# Patient Record
Sex: Female | Born: 1995 | Race: Black or African American | Hispanic: No | Marital: Single | Smoking: Never smoker
Health system: Southern US, Community
[De-identification: ages and names within clinical notes are randomized; demographics above are authoritative.]

## PROBLEM LIST (undated history)

## (undated) DIAGNOSIS — E669 Obesity, unspecified: Secondary | ICD-10-CM

## (undated) DIAGNOSIS — J45909 Unspecified asthma, uncomplicated: Secondary | ICD-10-CM

---
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
## 2015-11-07 ENCOUNTER — Emergency Department (HOSPITAL_COMMUNITY)
Admission: EM | Admit: 2015-11-07 | Discharge: 2015-11-07 | Disposition: A | Discharge status: Home / Self Care | Payer: Medicaid Other | Attending: Emergency Medicine | Admitting: Emergency Medicine

## 2015-11-07 ENCOUNTER — Encounter (HOSPITAL_COMMUNITY): Payer: Self-pay | Admitting: *Deleted

## 2015-11-07 DIAGNOSIS — J45909 Unspecified asthma, uncomplicated: Secondary | ICD-10-CM | POA: Insufficient documentation

## 2015-11-07 DIAGNOSIS — E669 Obesity, unspecified: Secondary | ICD-10-CM | POA: Insufficient documentation

## 2015-11-07 DIAGNOSIS — J020 Streptococcal pharyngitis: Secondary | ICD-10-CM | POA: Diagnosis not present

## 2015-11-07 DIAGNOSIS — J029 Acute pharyngitis, unspecified: Secondary | ICD-10-CM | POA: Diagnosis present

## 2015-11-07 DIAGNOSIS — Z79899 Other long term (current) drug therapy: Secondary | ICD-10-CM | POA: Insufficient documentation

## 2015-11-07 DIAGNOSIS — H748X3 Other specified disorders of middle ear and mastoid, bilateral: Secondary | ICD-10-CM | POA: Insufficient documentation

## 2015-11-07 LAB — RAPID STREP SCREEN (MED CTR MEBANE ONLY): STREPTOCOCCUS, GROUP A SCREEN (DIRECT): POSITIVE — AB

## 2015-11-07 MED ORDER — CETIRIZINE HCL 10 MG PO TABS: 10.0000 mg | ORAL_TABLET | Freq: Every day | ORAL | Status: DC

## 2015-11-07 MED ORDER — AMOXICILLIN 500 MG PO CAPS
500.0000 mg | ORAL_CAPSULE | Freq: Once | ORAL | Status: AC
  Administered 2015-11-07: 500 mg via ORAL
  Filled 2015-11-07: qty 1

## 2015-11-07 MED ORDER — NAPROXEN 250 MG PO TABS: 250.0000 mg | ORAL_TABLET | Freq: Two times a day (BID) | ORAL | Status: DC

## 2015-11-07 MED ORDER — AMOXICILLIN 500 MG PO CAPS: 500.0000 mg | ORAL_CAPSULE | Freq: Three times a day (TID) | ORAL | Status: DC

## 2015-11-07 MED ORDER — DEXAMETHASONE 10 MG/ML FOR PEDIATRIC ORAL USE
10.0000 mg | Freq: Once | INTRAMUSCULAR | Status: AC
  Administered 2015-11-07: 10 mg via ORAL
  Filled 2015-11-07: qty 1

## 2015-11-07 NOTE — ED Notes (Signed)
Pt reports sore throat and h/a x 2 days.  Worse when swallowing.

## 2015-11-07 NOTE — Progress Notes (Signed)
Patient listed as having Medicaid [place] [employer] insurance.  Pcp listed on patient's insurance card is located at [facility] [phone].  Patient confirms this is her pcp.  System updated.

## 2015-11-07 NOTE — Discharge Instructions (Signed)

## 2015-11-07 NOTE — ED Provider Notes (Signed)
CSN: [id number]     Arrival date & time 11/07/15  1443 History  By signing my name below, I, Freida Busman, attest that this documentation has been prepared under the direction and in the presence of non-physician practitioner, Everlene Farrier, PA-C. Electronically Signed: Freida Busman, Scribe. 11/07/2015. 5:19 PM.   Chief Complaint  Patient presents with  . Sore Throat    The history is provided by the patient. No language interpreter was used.    HPI Comments:  Heather Gordon is a 20 y.o. female who presents to the Emergency Department complaining of sore throat x 3 days. She reports 5/10 pain. Pt is able to swallow her secretions but notes her pain is worse when doing so. Pt reports associated bilateral ear pressure and fever that has resolved.  She denies cough and neck pain. No alleviating factors noted. No trouble swallowing.    Past Medical History  Diagnosis Date  . Obese   . Asthma    History reviewed. No pertinent past surgical history. No family history on file. Social History  Substance Use Topics  . Smoking status: Never Smoker   . Smokeless tobacco: None  . Alcohol Use: No   OB History    No data available     Review of Systems  Constitutional: Positive for fever (resolved).  HENT: Positive for ear pain and sore throat. Negative for congestion, rhinorrhea, trouble swallowing and voice change.   Eyes: Negative for visual disturbance.  Respiratory: Negative for cough and shortness of breath.   Musculoskeletal: Negative for neck pain and neck stiffness.  Skin: Negative for rash.  Neurological: Negative for light-headedness and headaches.   Allergies  Review of patient's allergies indicates no known allergies.  Home Medications   Prior to Admission medications   Medication Sig Start Date End Date Taking? Authorizing Provider  ibuprofen (ADVIL,MOTRIN) 200 MG tablet Take 200 mg by mouth every 6 (six) hours as needed for headache.   Yes Historical Provider, MD   albuterol (PROVENTIL HFA;VENTOLIN HFA) 108 (90 BASE) MCG/ACT inhaler Inhale 1-2 puffs into the lungs every 6 (six) hours as needed for wheezing. 03/04/13   Santiago Glad, PA-C  amoxicillin (AMOXIL) 500 MG capsule Take 1 capsule (500 mg total) by mouth 3 (three) times daily. 11/07/15   Everlene Farrier, PA-C  cetirizine (ZYRTEC ALLERGY) 10 MG tablet Take 1 tablet (10 mg total) by mouth daily. 11/07/15   Everlene Farrier, PA-C  naproxen (NAPROSYN) 250 MG tablet Take 1 tablet (250 mg total) by mouth 2 (two) times daily with a meal. 11/07/15   Everlene Farrier, PA-C  predniSONE (DELTASONE) 20 MG tablet Take 3 tablets (60 mg total) by mouth daily. Patient not taking: Reported on 11/07/2015 03/04/13   Santiago Glad, PA-C   BP 122/87 mmHg  Pulse 101  Temp(Src) 98.8 F (37.1 C) (Oral)  Resp 14  Ht  (1.651 m)  Wt 163.295 kg  BMI 59.91 kg/m2  SpO2 100%  LMP  Physical Exam  Constitutional: She appears well-developed and well-nourished. No distress.  Nontoxic appearing.  HENT:  Head: Normocephalic and atraumatic.  Right Ear: External ear normal. A middle ear effusion is present.  Left Ear: External ear normal. A middle ear effusion is present.  Mouth/Throat: Uvula is midline. No trismus in the jaw. No oropharyngeal exudate.  No TM erythema or loss of landmarks. Mild middle ear effusion bilaterally.  Moderate bilateral tonsillar hyperthrophy without exudate Uvula is midline without edema No trismus, or dooling. No PTA.  Eyes: Conjunctivae are normal. Pupils are equal, round, and reactive to light. Right eye exhibits no discharge. Left eye exhibits no discharge.  Neck: Normal range of motion. Neck supple. No JVD present. No tracheal deviation present.  Cardiovascular: Normal rate, regular rhythm, normal heart sounds and intact distal pulses.   Pulmonary/Chest: Effort normal and breath sounds normal. No stridor. No respiratory distress. She has no wheezes. She has no rales.  Abdominal: Soft. There  is no tenderness. There is no guarding.  Lymphadenopathy:    She has no cervical adenopathy.  Neurological: She is alert. Coordination normal.  Skin: Skin is warm and dry. No rash noted. She is not diaphoretic. No erythema. No pallor.  Psychiatric: She has a normal mood and affect. Her behavior is normal.  Nursing note and vitals reviewed.   ED Course  Procedures   DIAGNOSTIC STUDIES:  Oxygen Saturation is 100% on RA, normal by my interpretation.    COORDINATION OF CARE:  5:18 PM Discussed treatment plan with pt at bedside and pt agreed to plan.  Labs Review Labs Reviewed  RAPID STREP SCREEN (NOT AT [facility]) - Abnormal; Notable for the following:    Streptococcus, Group A Screen (Direct) POSITIVE (*)    All other components within normal limits    Imaging Review No results found. I have personally reviewed and evaluated these lab results as part of my medical decision-making.   MDM  This is a 20 y.o. female who presents to the Emergency Department complaining of sore throat x 3 days. She reports 5/10 pain. Pt is able to swallow her secretions but notes her pain is worse when doing so. Pt reports associated bilateral ear pressure and fever that has resolved.   Pt rapid strep test positive. Pt is tolerating secretions. Presentation not concerning for peritonsillar abscess or spread of infection to deep spaces of the throat; patent airway. Pt will be discharged with Amoxicillin. Decadron prior to discharge. She is tolerating PO.   Specific return precautions discussed. Recommended PCP follow up. Pt appears safe for discharge.  I advised the patient to follow-up with their primary care provider this week. I advised the patient to return to the emergency department with new or worsening symptoms or new concerns. The patient verbalized understanding and agreement with plan.     Final diagnoses:  Strep throat    I personally performed the services described in this documentation, which  was scribed in my presence. The recorded information has been reviewed and is accurate.      Everlene FarrierWilliam Yamir Carignan, PA-C 11/07/15 1728  Richardean Canalavid H Yao, MD 11/09/15 [phone]

## 2017-02-08 ENCOUNTER — Emergency Department (HOSPITAL_COMMUNITY)
Admission: EM | Admit: 2017-02-08 | Discharge: 2017-02-09 | Disposition: A | Discharge status: Home / Self Care | Payer: No Typology Code available for payment source | Attending: Emergency Medicine | Admitting: Emergency Medicine

## 2017-02-08 ENCOUNTER — Encounter (HOSPITAL_COMMUNITY): Payer: Self-pay | Admitting: Emergency Medicine

## 2017-02-08 DIAGNOSIS — Y999 Unspecified external cause status: Secondary | ICD-10-CM | POA: Insufficient documentation

## 2017-02-08 DIAGNOSIS — J45909 Unspecified asthma, uncomplicated: Secondary | ICD-10-CM | POA: Diagnosis not present

## 2017-02-08 DIAGNOSIS — Y9389 Activity, other specified: Secondary | ICD-10-CM | POA: Diagnosis not present

## 2017-02-08 DIAGNOSIS — T148XXA Other injury of unspecified body region, initial encounter: Secondary | ICD-10-CM

## 2017-02-08 DIAGNOSIS — S199XXA Unspecified injury of neck, initial encounter: Secondary | ICD-10-CM | POA: Diagnosis present

## 2017-02-08 DIAGNOSIS — Z791 Long term (current) use of non-steroidal anti-inflammatories (NSAID): Secondary | ICD-10-CM | POA: Diagnosis not present

## 2017-02-08 DIAGNOSIS — Y929 Unspecified place or not applicable: Secondary | ICD-10-CM | POA: Diagnosis not present

## 2017-02-08 DIAGNOSIS — S161XXA Strain of muscle, fascia and tendon at neck level, initial encounter: Secondary | ICD-10-CM | POA: Diagnosis not present

## 2017-02-08 DIAGNOSIS — Z79899 Other long term (current) drug therapy: Secondary | ICD-10-CM | POA: Insufficient documentation
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
## 2017-02-09 MED ORDER — METHOCARBAMOL 500 MG PO TABS: 500.0000 mg | ORAL_TABLET | Freq: Two times a day (BID) | ORAL | 0 refills | Status: DC

## 2017-02-09 MED ORDER — ACETAMINOPHEN 325 MG PO TABS
650.0000 mg | ORAL_TABLET | Freq: Once | ORAL | Status: AC
  Administered 2017-02-09: 650 mg via ORAL
  Filled 2017-02-09: qty 2
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
## 2020-01-11 ENCOUNTER — Encounter (HOSPITAL_COMMUNITY): Payer: Self-pay

## 2020-01-11 ENCOUNTER — Emergency Department (HOSPITAL_COMMUNITY)
Admission: EM | Admit: 2020-01-11 | Discharge: 2020-01-11 | Discharge status: Against Medical Advice | Payer: BLUE CROSS/BLUE SHIELD | Attending: Emergency Medicine | Admitting: Emergency Medicine

## 2020-01-11 DIAGNOSIS — Z5321 Procedure and treatment not carried out due to patient leaving prior to being seen by health care provider: Secondary | ICD-10-CM | POA: Insufficient documentation

## 2020-01-11 DIAGNOSIS — H5712 Ocular pain, left eye: Secondary | ICD-10-CM | POA: Insufficient documentation

## 2020-01-11 MED ORDER — FLUORESCEIN SODIUM 1 MG OP STRP: 1.0000 | ORAL_STRIP | Freq: Once | OPHTHALMIC | Status: DC

## 2020-01-11 MED ORDER — TETRACAINE HCL 0.5 % OP SOLN: 2.0000 [drp] | Freq: Once | OPHTHALMIC | Status: DC

## 2020-01-11 NOTE — ED Triage Notes (Signed)
Pt reports that she thinks she got a piece of hair in her L eye and since has had blurry vision and pain.

## 2023-05-14 ENCOUNTER — Ambulatory Visit: Payer: BLUE CROSS/BLUE SHIELD | Attending: Cardiology | Admitting: Cardiology

## 2023-05-14 ENCOUNTER — Encounter: Payer: Self-pay | Admitting: Cardiology

## 2023-05-14 VITALS — BP 98/72 | HR 80 | Ht 65.0 in | Wt >= 6400 oz

## 2023-05-14 DIAGNOSIS — R634 Abnormal weight loss: Secondary | ICD-10-CM | POA: Diagnosis not present

## 2023-05-14 DIAGNOSIS — R7982 Elevated C-reactive protein (CRP): Secondary | ICD-10-CM | POA: Diagnosis not present

## 2023-05-14 DIAGNOSIS — R0602 Shortness of breath: Secondary | ICD-10-CM

## 2023-05-14 DIAGNOSIS — R7303 Prediabetes: Secondary | ICD-10-CM | POA: Diagnosis not present

## 2023-05-14 DIAGNOSIS — Z8249 Family history of ischemic heart disease and other diseases of the circulatory system: Secondary | ICD-10-CM

## 2023-05-14 DIAGNOSIS — Z6841 Body Mass Index (BMI) 40.0 and over, adult: Secondary | ICD-10-CM

## 2023-05-14 NOTE — Progress Notes (Signed)
Cardiology Office Note:    Date:  05/18/2023   ID:  Heather Gordon, DOB 03/16/1996, MRN [id number]  PCP:  Medicine, [facility] (Inactive)  Cardiologist:  Thomasene Ripple, DO  Electrophysiologist:  None   Referring MD: Joylene Igo, MD   ' I am doing ok"  History of Present Illness:    Heather Gordon is a 27 y.o. female with a hx of ith a history of prediabetes and vitamin D deficiency, presents with a weight loss plateau and intermittent numbness in her extremities. She has been trying to lose weight and has been seeing a sleep doctor. She describes the numbness as if her arm or leg is 'falling asleep.' She also reports shortness of breath during workouts. She was previously on semaglutide for weight loss, but discontinued it due to severe constipation. She has a family history of heart disease on her father's side.  Past Medical History:  Diagnosis Date   Asthma    Obese     No past surgical history on file.  Current Medications: Current Meds  Medication Sig   DUPIXENT 300 MG/2ML SOAJ Inject 300 mg into the skin every 14 (fourteen) days.     Allergies:   Patient has no known allergies.   Social History   Socioeconomic History   Marital status: Married    Spouse name: Not on file   Number of children: Not on file   Years of education: Not on file   Highest education level: Not on file  Occupational History   Not on file  Tobacco Use   Smoking status: Never   Smokeless tobacco: Never  Substance and Sexual Activity   Alcohol use: No   Drug use: No   Sexual activity: Never  Other Topics Concern   Not on file  Social History Narrative   Not on file   Social Determinants of Health   Financial Resource Strain: Not on file  Food Insecurity: No Food Insecurity (09/28/2020)   Received from [facility]   Hunger Vital Sign    Worried About Running Out of Food in the Last Year: Never true    Ran Out of Food in the Last Year: Never true   Transportation Needs: Not on file  Physical Activity: Not on file  Stress: Not on file  Social Connections: Unknown (11/27/2021)   Received from [facility]   Social Network    Social Network: Not on file     Family History: The patient's family history is not on file.  ROS:   Review of Systems  Constitution: Negative for decreased appetite, fever and weight gain.  HENT: Negative for congestion, ear discharge, hoarse voice and sore throat.   Eyes: Negative for discharge, redness, vision loss in right eye and visual halos.  Cardiovascular: Negative for chest pain, dyspnea on exertion, leg swelling, orthopnea and palpitations.  Respiratory: Negative for cough, hemoptysis, shortness of breath and snoring.   Endocrine: Negative for heat intolerance and polyphagia.  Hematologic/Lymphatic: Negative for bleeding problem. Does not bruise/bleed easily.  Skin: Negative for flushing, nail changes, rash and suspicious lesions.  Musculoskeletal: Negative for arthritis, joint pain, muscle cramps, myalgias, neck pain and stiffness.  Gastrointestinal: Negative for abdominal pain, bowel incontinence, diarrhea and excessive appetite.  Genitourinary: Negative for decreased libido, genital sores and incomplete emptying.  Neurological: Negative for brief paralysis, focal weakness, headaches and loss of balance.  Psychiatric/Behavioral: Negative for altered mental status, depression and suicidal ideas.  Allergic/Immunologic: Negative for  HIV exposure and persistent infections.    EKGs/Labs/Other Studies Reviewed:    The following studies were reviewed today:   EKG:  The ekg ordered today demonstrates   Recent Labs: No results found for requested labs within last 365 days.  Recent Lipid Panel No results found for: "CHOL", "TRIG", "HDL", "CHOLHDL", "VLDL", "LDLCALC", "LDLDIRECT"  Physical Exam:    VS:  BP 98/72 (BP Location: Right Arm, Patient Position: Sitting) Comment (Cuff Size): Thigh cuff   Pulse 80   Ht 5\' 5"  (1.651 m)   Wt (!) 422 lb 6.4 oz (191.6 kg)   SpO2 95%   BMI 70.29 kg/m     Wt Readings from Last 3 Encounters:  05/14/23 (!) 422 lb 6.4 oz (191.6 kg)  02/08/17 (!) 365 lb (165.6 kg)  11/07/15 (!) 360 lb (163.3 kg) (>99%, Z= 3.10)*   * Growth percentiles are based on CDC (Girls, 2-20 Years) data.     GEN: Well nourished, well developed in no acute distress HEENT: Normal NECK: No JVD; No carotid bruits LYMPHATICS: No lymphadenopathy CARDIAC: S1S2 noted,RRR, no murmurs, rubs, gallops RESPIRATORY:  Clear to auscultation without rales, wheezing or rhonchi  ABDOMEN: Soft, non-tender, non-distended, +bowel sounds, no guarding. EXTREMITIES: No edema, No cyanosis, no clubbing MUSCULOSKELETAL:  No deformity  SKIN: Warm and dry NEUROLOGIC:  Alert and oriented x 3, non-focal PSYCHIATRIC:  Normal affect, good insight  ASSESSMENT:    1. BMI 70 and over, adult (HCC)   2. Elevated C-reactive protein   3. Weight loss    PLAN:    Weight Management Patient reports a plateau in weight loss efforts. Previously tried semaglutide but discontinued due to constipation. No current weight loss medications. -Refer to pharmacy team for evaluation of eligibility for weight loss medications.  Prediabetes No current treatment mentioned. -Continue monitoring.   Shortness of breath Patient reports shortness of breath during exercise only. No further evaluation or treatment discussed in this visit. -Continue monitoring.   General Health Maintenance -Continue regular follow-up with primary care provider.  The patient is in agreement with the above plan. The patient left the office in stable condition.  The patient will follow up in   Medication Adjustments/Labs and Tests Ordered: Current medicines are reviewed at length with the patient today.  Concerns regarding medicines are outlined above.  Orders Placed This Encounter  Procedures   AMB Referral to [facility] Pharm-D    EKG 12-Lead   No orders of the defined types were placed in this encounter.   Patient Instructions  Medication Instructions:  Your physician recommends that you continue on your current medications as directed. Please refer to the Current Medication list given to you today.  *If you need a refill on your cardiac medications before your next appointment, please call your pharmacy*   Lab Work: None   Testing/Procedures: None   Follow-Up: At [facility], you and your health needs are our priority.  As part of our continuing mission to provide you with exceptional heart care, we have created designated Provider Care Teams.  These Care Teams include your primary Cardiologist (physician) and Advanced Practice Providers (APPs -  Physician Assistants and Nurse Practitioners) who all work together to provide you with the care you need, when you need it.  Your next appointment:    As needed  Provider:   Thomasene Ripple, DO   Other Instructions Referral for Pharm-D has been please. Please schedule an appointment with them.     Adopting a Healthy Lifestyle.  Know what a healthy weight is for you (roughly BMI <25) and aim to maintain this   Aim for 7+ servings of fruits and vegetables daily   65-80+ fluid ounces of water or unsweet tea for healthy kidneys   Limit to max 1 drink of alcohol per day; avoid smoking/tobacco   Limit animal fats in diet for cholesterol and heart health - choose grass fed whenever available   Avoid highly processed foods, and foods high in saturated/trans fats   Aim for low stress - take time to unwind and care for your mental health   Aim for 150 min of moderate intensity exercise weekly for heart health, and weights twice weekly for bone health   Aim for 7-9 hours of sleep daily   When it comes to diets, agreement about the perfect plan isnt easy to find, even among the experts. Experts at the [facility] [employer] developed an  idea known as the Healthy Eating Plate. Just imagine a plate divided into logical, healthy portions.   The emphasis is on diet quality:   Load up on vegetables and fruits - one-half of your plate: Aim for color and variety, and remember that potatoes dont count.   Go for whole grains - one-quarter of your plate: Whole wheat, barley, wheat berries, quinoa, oats, brown rice, and foods made with them. If you want pasta, go with whole wheat pasta.   Protein power - one-quarter of your plate: Fish, chicken, beans, and nuts are all healthy, versatile protein sources. Limit red meat.   The diet, however, does go beyond the plate, offering a few other suggestions.   Use healthy plant oils, such as olive, canola, soy, corn, sunflower and peanut. Check the labels, and avoid partially hydrogenated oil, which have unhealthy trans fats.   If youre thirsty, drink water. Coffee and tea are good in moderation, but skip sugary drinks and limit milk and dairy products to one or two daily servings.   The type of carbohydrate in the diet is more important than the amount. Some sources of carbohydrates, such as vegetables, fruits, whole grains, and beans-are healthier than others.   Finally, stay active  Signed, Thomasene Ripple, DO  05/18/2023 3:32 PM    [facility]

## 2023-05-14 NOTE — Patient Instructions (Signed)
Medication Instructions:  Your physician recommends that you continue on your current medications as directed. Please refer to the Current Medication list given to you today.  *If you need a refill on your cardiac medications before your next appointment, please call your pharmacy*   Lab Work: None   Testing/Procedures: None   Follow-Up: At [facility], you and your health needs are our priority.  As part of our continuing mission to provide you with exceptional heart care, we have created designated Provider Care Teams.  These Care Teams include your primary Cardiologist (physician) and Advanced Practice Providers (APPs -  Physician Assistants and Nurse Practitioners) who all work together to provide you with the care you need, when you need it.  Your next appointment:    As needed  Provider:   Thomasene Ripple, DO   Other Instructions Referral for Pharm-D has been please. Please schedule an appointment with them.

## 2023-06-08 NOTE — Progress Notes (Unsigned)
Patient ID: Heather Gordon                 DOB: [birth year]                    MRN: [id number]     HPI: Heather Gordon is a 27 y.o. female patient referred to pharmacy clinic by Dr Servando Salina for weight loss consult PMH is significant for pre DM, and obesity. Most recent BMI 71.56.  Patient presents today to discuss weight loss. Previously seen by a provider on [address] at [facility]. Was prescribed compounded Ozempic and had to return every week for an injection. This was inconvenient for her and her work schedule.   Began having side effects such as constipation and heavy, long periods. Does not know which strength of medication she was on.  Referred for bariatric surgery at [facility] in 2022. However it required 6 months of counseling before surgery could be scheduled and patient reports providers office repeatedly would reschedule her at the last minute and she was not able to complete the entire 6 month session.  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   Not on any other medications other than Dupixent which she self injects at home.  No current A1c on file.  Labs: No results found for: "HGBA1C"  Wt Readings from Last 1 Encounters:  05/14/23 (!) 422 lb 6.4 oz (191.6 kg)    BP Readings from Last 1 Encounters:  05/14/23 98/72   Pulse Readings from Last 1 Encounters:  05/14/23 80    No results found for: "CHOL", "TRIG", "HDL", "CHOLHDL", "VLDL", "LDLCALC", "LDLDIRECT"  Past Medical History:  Diagnosis Date   Asthma    Obese     Current Outpatient Medications on File Prior to Visit  Medication Sig Dispense Refill   albuterol (PROVENTIL HFA;VENTOLIN HFA) 108 (90 BASE) MCG/ACT inhaler Inhale 1-2 puffs into the lungs every 6 (six) hours as needed for wheezing. 1 Inhaler 0   amoxicillin (AMOXIL) 500 MG capsule Take 1 capsule (500 mg total) by mouth 3 (three) times daily. 30 capsule 0   cetirizine (ZYRTEC ALLERGY) 10 MG  tablet Take 1 tablet (10 mg total) by mouth daily. 30 tablet 1   DUPIXENT 300 MG/2ML SOAJ Inject 300 mg into the skin every 14 (fourteen) days.     ibuprofen (ADVIL,MOTRIN) 200 MG tablet Take 200 mg by mouth every 6 (six) hours as needed for headache.     methocarbamol (ROBAXIN) 500 MG tablet Take 1 tablet (500 mg total) by mouth 2 (two) times daily. 20 tablet 0   naproxen (NAPROSYN) 250 MG tablet Take 1 tablet (250 mg total) by mouth 2 (two) times daily with a meal. 30 tablet 0   predniSONE (DELTASONE) 20 MG tablet Take 3 tablets (60 mg total) by mouth daily. (Patient not taking: Reported on 11/07/2015) 12 tablet 0   No current facility-administered medications on file prior to visit.    No Known Allergies   Assessment/Plan:  1. Weight loss - Patient BMI today 71.56 placing her in class III obesity category. Would benefit from weight loss therapy. Possible her side effects were due from a compounded product.    Confirmed patient not pregnant and no personal or family history of medullary thyroid carcinoma (MTC) or Multiple Endocrine Neoplasia syndrome type 2 (MEN 2). Injection technique reviewed at today's visit.  Will update A1c today to confirm not  diabetic. Will then submit PA for Wegovy/Ozempic or Zepbound/Mounjaro. Injection techniques for all devices reviews as well as mechanism of action, storage and site selection.  Laural Golden, PharmD, BCACP, CDCES, CPP [address], [address] [place], [place], [address] Phone: [phone], Fax: [phone]

## 2023-06-09 ENCOUNTER — Ambulatory Visit: Payer: BLUE CROSS/BLUE SHIELD | Attending: Cardiovascular Disease | Admitting: Pharmacist

## 2023-06-09 ENCOUNTER — Encounter: Payer: Self-pay | Admitting: Pharmacist

## 2023-06-09 VITALS — Ht 65.0 in | Wt >= 6400 oz

## 2023-06-09 DIAGNOSIS — Z6841 Body Mass Index (BMI) 40.0 and over, adult: Secondary | ICD-10-CM

## 2023-06-09 DIAGNOSIS — E669 Obesity, unspecified: Secondary | ICD-10-CM | POA: Insufficient documentation

## 2023-06-09 DIAGNOSIS — R7303 Prediabetes: Secondary | ICD-10-CM | POA: Insufficient documentation

## 2023-06-09 NOTE — Patient Instructions (Addendum)
It was nice meeting you today  The medications we spoke about today are called Wegovy and Zepbound  We will update your A1c today to screen for diabetes  Depending on the results, I will complete a prior authorization for both medications  Please message with any questions  Laural Golden, PharmD, BCACP, CDCES, CPP [address] [place], [place], [address] Phone: [phone], Fax: [phone]

## 2023-06-10 LAB — HEMOGLOBIN A1C
Est. average glucose Bld gHb Est-mCnc: 120 mg/dL
Hgb A1c MFr Bld: 5.8 % — ABNORMAL HIGH (ref 4.8–5.6)

## 2023-06-23 ENCOUNTER — Encounter: Payer: Self-pay | Admitting: Pharmacist

## 2023-06-24 ENCOUNTER — Other Ambulatory Visit (HOSPITAL_COMMUNITY): Payer: Self-pay

## 2023-06-24 ENCOUNTER — Telehealth: Payer: Self-pay | Admitting: Pharmacist Clinician (PhC)/ Clinical Pharmacy Specialist

## 2023-06-24 ENCOUNTER — Telehealth: Payer: Self-pay | Admitting: Pharmacy Technician

## 2023-06-24 NOTE — Telephone Encounter (Signed)
Pharmacy Patient Advocate Encounter   Received notification from Pt Calls Messages that prior authorization for wegovy is required/requested.   Insurance verification completed.   The patient is insured through [facility] .   Per test claim: PA required; PA submitted to above mentioned insurance via CoverMyMeds Key/confirmation #/EOC [id number] Status is pending

## 2023-06-24 NOTE — Telephone Encounter (Signed)
Could you please do PA for [facility]

## 2023-06-26 NOTE — Telephone Encounter (Signed)
Pharmacy Patient Advocate Encounter  Received notification from [employer] that Prior Authorization for wegovy has been DENIED.  See denial reason below. No denial letter attached in CMM. Will attach denial letter to Media tab once received.   PA #/Case ID/Reference #: [id number]

## 2023-06-30 ENCOUNTER — Other Ambulatory Visit (HOSPITAL_COMMUNITY): Payer: Self-pay
